# Patient Record
Sex: Male | Born: 1985 | Race: White | Hispanic: No | Marital: Single | Smoking: Current every day smoker
Health system: Southern US, Community
[De-identification: ages and names within clinical notes are randomized; demographics above are authoritative.]

## PROBLEM LIST (undated history)

## (undated) DIAGNOSIS — I10 Essential (primary) hypertension: Secondary | ICD-10-CM

## (undated) DIAGNOSIS — R011 Cardiac murmur, unspecified: Secondary | ICD-10-CM

## (undated) HISTORY — PX: FRACTURE SURGERY: SHX138

---

## 2012-10-03 HISTORY — PX: PILONIDAL CYST EXCISION: SHX744

## 2012-10-03 HISTORY — PX: TONSILLECTOMY: SUR1361

## 2013-02-09 ENCOUNTER — Other Ambulatory Visit (HOSPITAL_COMMUNITY): Payer: Self-pay | Admitting: Orthopaedic Surgery

## 2013-02-09 ENCOUNTER — Encounter (HOSPITAL_COMMUNITY)
Admission: AD | Disposition: A | Discharge status: Home / Self Care | Payer: Self-pay | Source: Ambulatory Visit | Attending: Orthopaedic Surgery

## 2013-02-09 ENCOUNTER — Encounter (HOSPITAL_COMMUNITY): Payer: Self-pay

## 2013-02-09 ENCOUNTER — Encounter (HOSPITAL_COMMUNITY): Payer: Self-pay | Admitting: Anesthesiology

## 2013-02-09 ENCOUNTER — Ambulatory Visit (HOSPITAL_COMMUNITY): Payer: PRIVATE HEALTH INSURANCE | Admitting: Anesthesiology

## 2013-02-09 ENCOUNTER — Ambulatory Visit (HOSPITAL_COMMUNITY)
Admission: AD | Admit: 2013-02-09 | Discharge: 2013-02-10 | Disposition: A | Discharge status: Home / Self Care | Payer: PRIVATE HEALTH INSURANCE | Source: Ambulatory Visit | Attending: Orthopaedic Surgery | Admitting: Orthopaedic Surgery

## 2013-02-09 DIAGNOSIS — S52372A Galeazzi's fracture of left radius, initial encounter for closed fracture: Secondary | ICD-10-CM

## 2013-02-09 DIAGNOSIS — S52309A Unspecified fracture of shaft of unspecified radius, initial encounter for closed fracture: Secondary | ICD-10-CM | POA: Insufficient documentation

## 2013-02-09 DIAGNOSIS — S63016A Dislocation of distal radioulnar joint of unspecified wrist, initial encounter: Secondary | ICD-10-CM | POA: Insufficient documentation

## 2013-02-09 DIAGNOSIS — I10 Essential (primary) hypertension: Secondary | ICD-10-CM | POA: Insufficient documentation

## 2013-02-09 HISTORY — DX: Cardiac murmur, unspecified: R01.1

## 2013-02-09 HISTORY — DX: Essential (primary) hypertension: I10

## 2013-02-09 HISTORY — PX: OPEN REDUCTION INTERNAL FIXATION (ORIF) DISTAL RADIAL FRACTURE: SHX5989

## 2013-02-09 LAB — POCT I-STAT 4, (NA,K, GLUC, HGB,HCT): Glucose, Bld: 84 mg/dL (ref 70–99)

## 2013-02-09 SURGERY — OPEN REDUCTION INTERNAL FIXATION (ORIF) DISTAL RADIUS FRACTURE
Anesthesia: General | Site: Arm Lower | Laterality: Left | Wound class: Clean

## 2013-02-09 MED ORDER — DEXTROSE 5 % IV SOLN
500.0000 mg | Freq: Four times a day (QID) | INTRAVENOUS | Status: DC | PRN
  Filled 2013-02-09: qty 5

## 2013-02-09 MED ORDER — HYDROMORPHONE HCL PF 1 MG/ML IJ SOLN: 1.0000 mg | INTRAMUSCULAR | Status: DC | PRN

## 2013-02-09 MED ORDER — SODIUM CHLORIDE 0.9 % IV SOLN
INTRAVENOUS | Status: DC
  Administered 2013-02-10: 04:00:00 via INTRAVENOUS

## 2013-02-09 MED ORDER — OXYCODONE HCL 5 MG PO TABS
5.0000 mg | ORAL_TABLET | ORAL | Status: DC | PRN
  Administered 2013-02-09: 5 mg via ORAL

## 2013-02-09 MED ORDER — ROCURONIUM BROMIDE 100 MG/10ML IV SOLN
INTRAVENOUS | Status: DC | PRN
  Administered 2013-02-09: 50 mg via INTRAVENOUS

## 2013-02-09 MED ORDER — FENTANYL CITRATE 0.05 MG/ML IJ SOLN
INTRAMUSCULAR | Status: DC | PRN
  Administered 2013-02-09 (×2): 50 ug via INTRAVENOUS
  Administered 2013-02-09: 100 ug via INTRAVENOUS
  Administered 2013-02-09: 50 ug via INTRAVENOUS

## 2013-02-09 MED ORDER — DIPHENHYDRAMINE HCL 12.5 MG/5ML PO ELIX: 12.5000 mg | ORAL_SOLUTION | ORAL | Status: DC | PRN

## 2013-02-09 MED ORDER — HYDROMORPHONE HCL PF 1 MG/ML IJ SOLN
INTRAMUSCULAR | Status: AC
  Administered 2013-02-09: 22:00:00
  Filled 2013-02-09: qty 1

## 2013-02-09 MED ORDER — ONDANSETRON HCL 4 MG/2ML IJ SOLN: 4.0000 mg | Freq: Four times a day (QID) | INTRAMUSCULAR | Status: DC | PRN

## 2013-02-09 MED ORDER — NEOSTIGMINE METHYLSULFATE 1 MG/ML IJ SOLN
INTRAMUSCULAR | Status: DC | PRN
  Administered 2013-02-09: 4 mg via INTRAVENOUS

## 2013-02-09 MED ORDER — LACTATED RINGERS IV SOLN
INTRAVENOUS | Status: DC
  Administered 2013-02-09: 18:00:00 via INTRAVENOUS

## 2013-02-09 MED ORDER — OXYCODONE HCL 5 MG PO TABS: 5.0000 mg | ORAL_TABLET | Freq: Once | ORAL | Status: DC | PRN

## 2013-02-09 MED ORDER — OXYCODONE HCL 5 MG PO TABS
ORAL_TABLET | ORAL | Status: AC
  Filled 2013-02-09: qty 1

## 2013-02-09 MED ORDER — SUCCINYLCHOLINE CHLORIDE 20 MG/ML IJ SOLN
INTRAMUSCULAR | Status: DC | PRN
  Administered 2013-02-09: 180 mg via INTRAVENOUS

## 2013-02-09 MED ORDER — ZOLPIDEM TARTRATE 5 MG PO TABS: 5.0000 mg | ORAL_TABLET | Freq: Every evening | ORAL | Status: DC | PRN

## 2013-02-09 MED ORDER — LACTATED RINGERS IV SOLN
INTRAVENOUS | Status: DC | PRN
  Administered 2013-02-09 (×2): via INTRAVENOUS

## 2013-02-09 MED ORDER — METOCLOPRAMIDE HCL 5 MG/ML IJ SOLN: 5.0000 mg | Freq: Three times a day (TID) | INTRAMUSCULAR | Status: DC | PRN

## 2013-02-09 MED ORDER — BUPIVACAINE HCL (PF) 0.25 % IJ SOLN
INTRAMUSCULAR | Status: DC | PRN
  Administered 2013-02-09: 10 mL

## 2013-02-09 MED ORDER — MORPHINE SULFATE 2 MG/ML IJ SOLN
1.0000 mg | INTRAMUSCULAR | Status: DC | PRN
  Administered 2013-02-09: 1 mg via INTRAVENOUS

## 2013-02-09 MED ORDER — FENTANYL CITRATE 0.05 MG/ML IJ SOLN
INTRAMUSCULAR | Status: AC
  Administered 2013-02-09: 100 ug via INTRAVENOUS
  Filled 2013-02-09: qty 2

## 2013-02-09 MED ORDER — METHOCARBAMOL 500 MG PO TABS
ORAL_TABLET | ORAL | Status: AC
  Filled 2013-02-09: qty 1

## 2013-02-09 MED ORDER — BUPIVACAINE HCL (PF) 0.25 % IJ SOLN
INTRAMUSCULAR | Status: AC
  Filled 2013-02-09: qty 30

## 2013-02-09 MED ORDER — HYDROMORPHONE HCL PF 1 MG/ML IJ SOLN: 0.2500 mg | INTRAMUSCULAR | Status: DC | PRN

## 2013-02-09 MED ORDER — CLINDAMYCIN PHOSPHATE 600 MG/50ML IV SOLN
600.0000 mg | Freq: Four times a day (QID) | INTRAVENOUS | Status: DC
  Administered 2013-02-10 (×2): 600 mg via INTRAVENOUS
  Filled 2013-02-09 (×5): qty 50

## 2013-02-09 MED ORDER — MORPHINE SULFATE 2 MG/ML IJ SOLN
INTRAMUSCULAR | Status: AC
  Filled 2013-02-09: qty 1

## 2013-02-09 MED ORDER — 0.9 % SODIUM CHLORIDE (POUR BTL) OPTIME
TOPICAL | Status: DC | PRN
  Administered 2013-02-09: 1000 mL

## 2013-02-09 MED ORDER — ONDANSETRON HCL 4 MG PO TABS: 4.0000 mg | ORAL_TABLET | Freq: Four times a day (QID) | ORAL | Status: DC | PRN

## 2013-02-09 MED ORDER — FENTANYL CITRATE 0.05 MG/ML IJ SOLN
INTRAMUSCULAR | Status: AC
  Administered 2013-02-09: 100 ug
  Filled 2013-02-09: qty 2

## 2013-02-09 MED ORDER — GLYCOPYRROLATE 0.2 MG/ML IJ SOLN
INTRAMUSCULAR | Status: DC | PRN
  Administered 2013-02-09: .6 mg via INTRAVENOUS

## 2013-02-09 MED ORDER — METOCLOPRAMIDE HCL 10 MG PO TABS: 5.0000 mg | ORAL_TABLET | Freq: Three times a day (TID) | ORAL | Status: DC | PRN

## 2013-02-09 MED ORDER — MIDAZOLAM HCL 2 MG/2ML IJ SOLN
INTRAMUSCULAR | Status: AC
  Administered 2013-02-09: 2 mg via INTRAVENOUS
  Filled 2013-02-09: qty 2

## 2013-02-09 MED ORDER — CLINDAMYCIN PHOSPHATE 900 MG/50ML IV SOLN
900.0000 mg | INTRAVENOUS | Status: DC
  Filled 2013-02-09: qty 50

## 2013-02-09 MED ORDER — HYDROCODONE-ACETAMINOPHEN 5-325 MG PO TABS
1.0000 | ORAL_TABLET | ORAL | Status: DC | PRN
  Administered 2013-02-10 (×3): 2 via ORAL
  Filled 2013-02-09 (×3): qty 2

## 2013-02-09 MED ORDER — MIDAZOLAM HCL 2 MG/2ML IJ SOLN
INTRAMUSCULAR | Status: AC
  Administered 2013-02-09: 2 mg
  Filled 2013-02-09: qty 2

## 2013-02-09 MED ORDER — CLINDAMYCIN PHOSPHATE 900 MG/50ML IV SOLN
900.0000 mg | INTRAVENOUS | Status: AC
  Administered 2013-02-09: 900 mg via INTRAVENOUS
  Filled 2013-02-09: qty 50

## 2013-02-09 MED ORDER — ONDANSETRON HCL 4 MG/2ML IJ SOLN
INTRAMUSCULAR | Status: DC | PRN
  Administered 2013-02-09: 4 mg via INTRAVENOUS

## 2013-02-09 MED ORDER — PROPOFOL 10 MG/ML IV BOLUS
INTRAVENOUS | Status: DC | PRN
  Administered 2013-02-09: 250 mg via INTRAVENOUS

## 2013-02-09 MED ORDER — LIDOCAINE HCL (CARDIAC) 20 MG/ML IV SOLN
INTRAVENOUS | Status: DC | PRN
  Administered 2013-02-09: 80 mg via INTRAVENOUS

## 2013-02-09 MED ORDER — METHOCARBAMOL 500 MG PO TABS
500.0000 mg | ORAL_TABLET | Freq: Four times a day (QID) | ORAL | Status: DC | PRN
  Administered 2013-02-09: 500 mg via ORAL
  Filled 2013-02-09: qty 1

## 2013-02-09 MED ORDER — OXYCODONE HCL 5 MG/5ML PO SOLN: 5.0000 mg | Freq: Once | ORAL | Status: DC | PRN

## 2013-02-09 SURGICAL SUPPLY — 64 items
BANDAGE ELASTIC 4 VELCRO ST LF (GAUZE/BANDAGES/DRESSINGS) ×2 IMPLANT
BANDAGE ELASTIC 6 VELCRO ST LF (GAUZE/BANDAGES/DRESSINGS) ×1 IMPLANT
BANDAGE GAUZE ELAST BULKY 4 IN (GAUZE/BANDAGES/DRESSINGS) ×2 IMPLANT
BIT DRILL 2.0 QC 6.3IN (BIT) ×1 IMPLANT
BIT DRILL QC 2.7 6.3IN  SHORT (BIT) ×1
BIT DRILL QC 2.7 6.3IN SHORT (BIT) IMPLANT
BNDG CMPR 9X4 STRL LF SNTH (GAUZE/BANDAGES/DRESSINGS) ×1
BNDG ESMARK 4X9 LF (GAUZE/BANDAGES/DRESSINGS) ×2 IMPLANT
CLOTH BEACON ORANGE TIMEOUT ST (SAFETY) ×2 IMPLANT
CORDS BIPOLAR (ELECTRODE) ×2 IMPLANT
COVER SURGICAL LIGHT HANDLE (MISCELLANEOUS) ×2 IMPLANT
CUFF TOURNIQUET SINGLE 24IN (TOURNIQUET CUFF) ×1 IMPLANT
DRAPE OEC MINIVIEW 54X84 (DRAPES) ×2 IMPLANT
DURAPREP 26ML APPLICATOR (WOUND CARE) ×2 IMPLANT
ELECT REM PT RETURN 9FT ADLT (ELECTROSURGICAL) ×2
ELECTRODE REM PT RTRN 9FT ADLT (ELECTROSURGICAL) ×1 IMPLANT
GAUZE XEROFORM 1X8 LF (GAUZE/BANDAGES/DRESSINGS) ×2 IMPLANT
GLOVE BIOGEL PI IND STRL 6.5 (GLOVE) IMPLANT
GLOVE BIOGEL PI IND STRL 8 (GLOVE) ×1 IMPLANT
GLOVE BIOGEL PI INDICATOR 6.5 (GLOVE) ×1
GLOVE BIOGEL PI INDICATOR 8 (GLOVE) ×2
GLOVE ORTHO TXT STRL SZ7.5 (GLOVE) ×1 IMPLANT
GLOVE SURG SS PI 7.5 STRL IVOR (GLOVE) ×1 IMPLANT
GOWN PREVENTION PLUS XLARGE (GOWN DISPOSABLE) ×4 IMPLANT
GOWN STRL NON-REIN LRG LVL3 (GOWN DISPOSABLE) ×2 IMPLANT
HOVERMATT SINGLE USE (MISCELLANEOUS) ×1 IMPLANT
K-WIRE 1.6 (WIRE) ×2
K-WIRE FX150X1.6XTROC PNT (WIRE) ×1
KIT BASIN OR (CUSTOM PROCEDURE TRAY) ×2 IMPLANT
KIT ROOM TURNOVER OR (KITS) ×2 IMPLANT
KWIRE FX150X1.6XTROC PNT (WIRE) IMPLANT
MANIFOLD NEPTUNE II (INSTRUMENTS) ×2 IMPLANT
NEEDLE 22X1 1/2 (OR ONLY) (NEEDLE) ×1 IMPLANT
NS IRRIG 1000ML POUR BTL (IV SOLUTION) ×2 IMPLANT
PACK ORTHO EXTREMITY (CUSTOM PROCEDURE TRAY) ×2 IMPLANT
PAD ARMBOARD 7.5X6 YLW CONV (MISCELLANEOUS) ×4 IMPLANT
PAD CAST 4YDX4 CTTN HI CHSV (CAST SUPPLIES) ×1 IMPLANT
PADDING CAST COTTON 4X4 STRL (CAST SUPPLIES) ×2
PADDING CAST COTTON 6X4 STRL (CAST SUPPLIES) ×1 IMPLANT
PLATE COMPRESSION (Plate) ×1 IMPLANT
SCREW 3.5X14MM (Screw) ×2 IMPLANT
SCREW CORTEX 2.7X20 (Screw) ×1 IMPLANT
SCREW NL 3.5X18 (Screw) ×2 IMPLANT
SCREW NON LOCK 3.5X16MM (Screw) ×3 IMPLANT
SPLINT PLASTER CAST XFAST 5X30 (CAST SUPPLIES) IMPLANT
SPLINT PLASTER XFAST SET 5X30 (CAST SUPPLIES) ×1
SPONGE GAUZE 4X4 12PLY (GAUZE/BANDAGES/DRESSINGS) ×2 IMPLANT
SPONGE LAP 18X18 X RAY DECT (DISPOSABLE) ×1 IMPLANT
SPONGE LAP 4X18 X RAY DECT (DISPOSABLE) ×1 IMPLANT
STRIP CLOSURE SKIN 1/2X4 (GAUZE/BANDAGES/DRESSINGS) ×2 IMPLANT
SUCTION FRAZIER TIP 10 FR DISP (SUCTIONS) ×2 IMPLANT
SUT ETHILON 3 0 PS 1 (SUTURE) ×3 IMPLANT
SUT PROLENE 3 0 PS 1 (SUTURE) ×2 IMPLANT
SUT VIC AB 0 CT1 27 (SUTURE) ×2
SUT VIC AB 0 CT1 27XBRD ANBCTR (SUTURE) IMPLANT
SUT VIC AB 2-0 CT1 27 (SUTURE) ×4
SUT VIC AB 2-0 CT1 TAPERPNT 27 (SUTURE) IMPLANT
SUT VIC AB 3-0 X1 27 (SUTURE) ×2 IMPLANT
SYR CONTROL 10ML LL (SYRINGE) ×1 IMPLANT
TOWEL OR 17X24 6PK STRL BLUE (TOWEL DISPOSABLE) ×2 IMPLANT
TOWEL OR 17X26 10 PK STRL BLUE (TOWEL DISPOSABLE) ×2 IMPLANT
TUBE CONNECTING 12X1/4 (SUCTIONS) ×2 IMPLANT
UNDERPAD 30X30 INCONTINENT (UNDERPADS AND DIAPERS) ×2 IMPLANT
WATER STERILE IRR 1000ML POUR (IV SOLUTION) ×2 IMPLANT

## 2013-02-09 NOTE — OR Nursing (Signed)
Reviewed with recv'ing nurse/genevieve rn, of delayed cap refil l thumb/index finger 3-4secs. of
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
## 2013-02-09 NOTE — Brief Op Note (Signed)
02/09/2013  8:33 PM  PATIENT:  Shane Sullivan  27 y.o. male  PRE-OPERATIVE DIAGNOSIS:  Left radial shaft fracture with wrist dislocation  POST-OPERATIVE DIAGNOSIS:  Left radial shaft fracture with wrist dislocation  PROCEDURE:  Procedure(s): OPEN REDUCTION INTERNAL FIXATION (ORIF) LEFT RADIAL SHAFT FRACTURE (Left)  SURGEON:  Surgeon(s) and Role:    * Kathryne Hitch, MD - Primary  PHYSICIAN ASSISTANT: Rexene Edison, PA-C   ANESTHESIA:   local and general  EBL:   min  BLOOD ADMINISTERED:none  DRAINS: none   LOCAL MEDICATIONS USED:  MARCAINE     SPECIMEN:  No Specimen  DISPOSITION OF SPECIMEN:  N/A  COUNTS:  YES  TOURNIQUET:   Total Tourniquet Time Documented: Upper Arm (Left) - 75 minutes Total: Upper Arm (Left) - 75 minutes   DICTATION: .Other Dictation: Dictation Number [id number]  PLAN OF CARE: Admit for overnight observation  PATIENT DISPOSITION:  PACU - hemodynamically stable.   Delay start of Pharmacological VTE agent (>24hrs) due to surgical blood loss or risk of bleeding: not applicable

## 2013-02-09 NOTE — OR Nursing (Signed)
Dr Magnus Ivan notified of diminished cap refill  Of l thumb and index finger/ hand/arm elevated on bedside table/ pt encouraged to exercise fingers.thumbs / MD does not want to loosen or adjust splint---> will place in elevator sling

## 2013-02-09 NOTE — Anesthesia Preprocedure Evaluation (Signed)
Anesthesia Evaluation  Patient identified by MRN, date of birth, ID band Patient awake    Reviewed: Allergy & Precautions, H&P , NPO status , Patient's Chart, lab work & pertinent test results  Airway Mallampati: II TM Distance: >3 FB Neck ROM: Full    Dental  (+) Dental Advisory Given   Pulmonary Current Smoker,          Cardiovascular     Neuro/Psych    GI/Hepatic   Endo/Other  Morbid obesity  Renal/GU      Musculoskeletal   Abdominal   Peds  Hematology   Anesthesia Other Findings Pt denies HTN, scheduled for sleep study to rule out sleep apnea  Reproductive/Obstetrics                           Anesthesia Physical Anesthesia Plan  ASA: II  Anesthesia Plan: General   Post-op Pain Management:    Induction: Intravenous  Airway Management Planned: Oral ETT  Additional Equipment:   Intra-op Plan:   Post-operative Plan: Extubation in OR  Informed Consent: I have reviewed the patients History and Physical, chart, labs and discussed the procedure including the risks, benefits and alternatives for the proposed anesthesia with the patient or authorized representative who has indicated his/her understanding and acceptance.   Dental advisory given  Plan Discussed with: CRNA, Anesthesiologist and Surgeon  Anesthesia Plan Comments:         Anesthesia Quick Evaluation

## 2013-02-09 NOTE — Progress Notes (Signed)
Orthopedic Tech Progress Note Patient Details:  Shane Sullivan Dec 11, 1985 [id number]  Ortho Devices Ortho Device/Splint Location: RUE kuzman sling Ortho Device/Splint Interventions: Ordered;Application   Jennye Moccasin 02/09/2013, 9:15 PM

## 2013-02-09 NOTE — Anesthesia Postprocedure Evaluation (Signed)
Anesthesia Post Note  Patient: Shane Sullivan  Procedure(s) Performed: Procedure(s) (LRB): OPEN REDUCTION INTERNAL FIXATION (ORIF) LEFT RADIAL SHAFT FRACTURE (Left)  Anesthesia type: General  Patient location: PACU  Post pain: Pain level controlled and Adequate analgesia  Post assessment: Post-op Vital signs reviewed, Patient's Cardiovascular Status Stable, Respiratory Function Stable, Patent Airway and Pain level controlled  Last Vitals:  Filed Vitals:   02/09/13 2054  BP: 144/92  Pulse: 81  Temp:   Resp: 24    Post vital signs: Reviewed and stable  Level of consciousness: awake, alert  and oriented  Complications: No apparent anesthesia complications

## 2013-02-09 NOTE — Anesthesia Procedure Notes (Signed)
Procedure Name: Intubation Date/Time: 02/09/2013 6:38 PM Performed by: Rogelia Boga Pre-anesthesia Checklist: Patient identified, Emergency Drugs available, Suction available, Patient being monitored and Timeout performed Patient Re-evaluated:Patient Re-evaluated prior to inductionOxygen Delivery Method: Circle system utilized Preoxygenation: Pre-oxygenation with 100% oxygen Intubation Type: IV induction and Rapid sequence Laryngoscope Size: Mac and 4 Grade View: Grade I Tube type: Oral Number of attempts: 1 Airway Equipment and Method: Stylet Placement Confirmation: ETT inserted through vocal cords under direct vision,  positive ETCO2 and breath sounds checked- equal and bilateral Secured at: 23 cm Tube secured with: Tape Dental Injury: Teeth and Oropharynx as per pre-operative assessment

## 2013-02-09 NOTE — Preoperative (Signed)
Beta Blockers   Reason not to administer Beta Blockers:Not Applicable 

## 2013-02-09 NOTE — Progress Notes (Signed)
OR, Velna Hatchet asked to make sure hover mat is on stretcher when they come to pick up patient.

## 2013-02-09 NOTE — Transfer of Care (Signed)
Immediate Anesthesia Transfer of Care Note  Patient: Shane Sullivan  Procedure(s) Performed: Procedure(s): OPEN REDUCTION INTERNAL FIXATION (ORIF) LEFT RADIAL SHAFT FRACTURE (Left)  Patient Location: PACU  Anesthesia Type:General  Level of Consciousness: awake, alert  and patient cooperative  Airway & Oxygen Therapy: Patient Spontanous Breathing and Patient connected to nasal cannula oxygen  Post-op Assessment: Report given to PACU RN, Post -op Vital signs reviewed and stable and Patient moving all extremities X 4  Post vital signs: Reviewed and stable  Complications: No apparent anesthesia complications

## 2013-02-10 MED ORDER — METHOCARBAMOL 500 MG PO TABS: 500.0000 mg | ORAL_TABLET | Freq: Four times a day (QID) | ORAL | Status: AC | PRN

## 2013-02-10 MED ORDER — OXYCODONE-ACETAMINOPHEN 5-325 MG PO TABS: 1.0000 | ORAL_TABLET | ORAL | Status: AC | PRN

## 2013-02-10 MED ORDER — VITAMIN C 500 MG PO TABS: 500.0000 mg | ORAL_TABLET | Freq: Every day | ORAL | Status: AC

## 2013-02-10 NOTE — Discharge Summary (Signed)
Patient ID: Shane Sullivan MRN: [id number] DOB/AGE: 1985/12/04 27 y.o.  Admit date: 02/09/2013 Discharge date: 02/10/2013  Admission Diagnoses:  Principal Problem:   Galeazzi's fracture of left radius   Discharge Diagnoses:  Same  Past Medical History  Diagnosis Date  . Heart murmur     as child  . Hypertension     took med for 1 month    Surgeries: Procedure(s): OPEN REDUCTION INTERNAL FIXATION (ORIF) LEFT RADIAL SHAFT FRACTURE on 02/09/2013   Consultants:  None  Discharged Condition: Improved  Hospital Course: Shane Sullivan is an 27 y.o. male who was admitted 02/09/2013 for operative treatment ofGaleazzi's fracture of left radius. Patient has severe unremitting pain that affects sleep, daily activities, and work/hobbies. After pre-op clearance the patient was taken to the operating room on 02/09/2013 and underwent  Procedure(s): OPEN REDUCTION INTERNAL FIXATION (ORIF) LEFT RADIAL SHAFT FRACTURE.    Patient was given perioperative antibiotics: Anti-infectives   Start     Dose/Rate Route Frequency Ordered Stop   02/10/13 0600  clindamycin (CLEOCIN) IVPB 900 mg     900 mg 100 mL/hr over 30 Minutes Intravenous On call to O.R. 02/09/13 1445 02/09/13 1848   02/10/13 0300  clindamycin (CLEOCIN) IVPB 600 mg     600 mg 100 mL/hr over 30 Minutes Intravenous Every 6 hours 02/09/13 2206 02/10/13 2059   02/09/13 1830  clindamycin (CLEOCIN) IVPB 900 mg  Status:  Discontinued     900 mg 100 mL/hr over 30 Minutes Intravenous To Surgery 02/09/13 1827 02/09/13 2126       Patient was given sequential compression devices, early ambulation, and chemoprophylaxis to prevent DVT.  Patient benefited maximally from hospital stay and there were no complications.    Recent vital signs: Patient Vitals for the past 24 hrs:  BP Temp Temp src Pulse Resp SpO2 Height Weight  02/10/13 0551 156/52 mmHg 98.2 F (36.8 C) - 112 20 93 % - -  02/09/13 2135 130/63 mmHg 98 F (36.7 C) - 68 20 95 % - -   02/09/13 2115 - 98.1 F (36.7 C) - - - - - -  02/09/13 2054 144/92 mmHg - - 81 24 96 % - -  02/09/13 2034 137/59 mmHg 98.8 F (37.1 C) - 84 27 96 % - -  02/09/13 1822 135/74 mmHg - - 84 23 100 % - -  02/09/13 1812 - - - - 24 100 % - -  02/09/13 1808 - - - - 22 100 % - -  02/09/13 1750 - - - 84 23 100 % - -  02/09/13 1531 128/61 mmHg 97.8 F (36.6 C) Oral 62 16 99 % - -  02/09/13 1528 - - - - - - 6' 6.5" (1.994 m) 166 kg (365 lb 15.4 oz)     Recent laboratory studies:  Recent Labs  02/09/13 1740  HGB 16.0  HCT 47.0  NA 142  K 4.0  GLUCOSE 84     Discharge Medications:     Medication List         methocarbamol 500 MG tablet  Commonly known as:  ROBAXIN  Take 1 tablet (500 mg total) by mouth every 6 (six) hours as needed.     oxyCODONE-acetaminophen 5-325 MG per tablet  Commonly known as:  ROXICET  Take 1-2 tablets by mouth every 4 (four) hours as needed for pain.     vitamin C 500 MG tablet  Commonly known as:  ASCORBIC ACID  Take 1 tablet (  500 mg total) by mouth daily.        Diagnostic Studies: No results found.  Disposition:Home      Discharge Orders   Future Orders Complete By Expires     Discharge wound care:  As directed     Comments:      Keep splint clean dry and intact    Elevate operative extremity  As directed     Comments:      Encourage patient to wiggle fingers often    Non weight bearing  As directed     Comments:      Non weight bearing left arm          Signed: Richardean Canal 02/10/2013, 7:58 AM

## 2013-02-10 NOTE — Progress Notes (Signed)
Subjective: 1 Day Post-Op Procedure(s) (LRB): OPEN REDUCTION INTERNAL FIXATION (ORIF) LEFT RADIAL SHAFT FRACTURE (Left) Patient reports pain as moderate.    Objective: Vital signs in last 24 hours: Temp:  [97.8 F (36.6 C)-98.8 F (37.1 C)] 98.2 F (36.8 C) (07/09 0551) Pulse Rate:  [62-112] 112 (07/09 0551) Resp:  [16-27] 20 (07/09 0551) BP: (128-156)/(52-92) 156/52 mmHg (07/09 0551) SpO2:  [93 %-100 %] 93 % (07/09 0551) Weight:  [166 kg (365 lb 15.4 oz)] 166 kg (365 lb 15.4 oz) (07/08 1528)  Intake/Output from previous day: 07/08 0701 - 07/09 0700 In: 1500 [P.O.:200; I.V.:1300] Out: -  Intake/Output this shift:     Recent Labs  02/09/13 1740  HGB 16.0    Recent Labs  02/09/13 1740  HCT 47.0    Recent Labs  02/09/13 1740  NA 142  K 4.0  GLUCOSE 84   No results found for this basename: LABPT, INR,  in the last 72 hours  Left upper extremity: Sensation intact left fingertips. Good capillary refill to fingers. Splint clean dry and intact.  Assessment/Plan: 1 Day Post-Op Procedure(s) (LRB): OPEN REDUCTION INTERNAL FIXATION (ORIF) LEFT RADIAL SHAFT FRACTURE (Left) Discharge to home Non weight bearing left upper extremity Keep splint clean dry and intact Follow up with Dr. Magnus Ivan in 2 weeks  [place], Sullivan Lone 02/10/2013, 7:50 AM
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
## 2013-02-12 ENCOUNTER — Encounter (HOSPITAL_COMMUNITY): Payer: Self-pay | Admitting: Orthopaedic Surgery

## 2013-03-17 ENCOUNTER — Encounter (HOSPITAL_COMMUNITY): Payer: Self-pay | Admitting: Orthopaedic Surgery

## 2013-03-19 ENCOUNTER — Encounter (HOSPITAL_COMMUNITY): Payer: Self-pay | Admitting: Orthopaedic Surgery

## 2020-03-20 ENCOUNTER — Emergency Department (HOSPITAL_COMMUNITY): Payer: PRIVATE HEALTH INSURANCE

## 2020-03-20 ENCOUNTER — Emergency Department (HOSPITAL_COMMUNITY)
Admission: EM | Admit: 2020-03-20 | Discharge: 2020-03-21 | Disposition: A | Discharge status: Home / Self Care | Payer: PRIVATE HEALTH INSURANCE | Attending: Emergency Medicine | Admitting: Emergency Medicine

## 2020-03-20 ENCOUNTER — Other Ambulatory Visit: Payer: Self-pay

## 2020-03-20 ENCOUNTER — Encounter (HOSPITAL_COMMUNITY): Payer: Self-pay | Admitting: Emergency Medicine

## 2020-03-20 DIAGNOSIS — I861 Scrotal varices: Secondary | ICD-10-CM | POA: Diagnosis not present

## 2020-03-20 DIAGNOSIS — Z79899 Other long term (current) drug therapy: Secondary | ICD-10-CM | POA: Insufficient documentation

## 2020-03-20 DIAGNOSIS — F17210 Nicotine dependence, cigarettes, uncomplicated: Secondary | ICD-10-CM | POA: Insufficient documentation

## 2020-03-20 DIAGNOSIS — N50812 Left testicular pain: Secondary | ICD-10-CM

## 2020-03-20 DIAGNOSIS — R1032 Left lower quadrant pain: Secondary | ICD-10-CM

## 2020-03-20 LAB — COMPREHENSIVE METABOLIC PANEL
ALT: 19 U/L (ref 0–44)
AST: 16 U/L (ref 15–41)
Albumin: 3.5 g/dL (ref 3.5–5.0)
Alkaline Phosphatase: 88 U/L (ref 38–126)
Anion gap: 9 (ref 5–15)
BUN: <5 mg/dL — ABNORMAL LOW (ref 6–20)
CO2: 24 mmol/L (ref 22–32)
Calcium: 9 mg/dL (ref 8.9–10.3)
Chloride: 102 mmol/L (ref 98–111)
Creatinine, Ser: 0.87 mg/dL (ref 0.61–1.24)
GFR calc Af Amer: >60 mL/min (ref >60)
GFR calc non Af Amer: >60 mL/min (ref >60)
Glucose, Bld: 107 mg/dL — ABNORMAL HIGH (ref 70–99)
Potassium: 3.6 mmol/L (ref 3.5–5.1)
Sodium: 135 mmol/L (ref 135–145)
Total Bilirubin: 0.6 mg/dL (ref 0.3–1.2)
Total Protein: 7.3 g/dL (ref 6.5–8.1)

## 2020-03-20 LAB — URINALYSIS, ROUTINE W REFLEX MICROSCOPIC
Bacteria, UA: NONE SEEN
Bilirubin Urine: NEGATIVE
Glucose, UA: NEGATIVE mg/dL
Ketones, ur: NEGATIVE mg/dL
Leukocytes,Ua: NEGATIVE
Nitrite: NEGATIVE
Protein, ur: NEGATIVE mg/dL
Specific Gravity, Urine: 1.024 (ref 1.005–1.030)
pH: 6 (ref 5.0–8.0)

## 2020-03-20 LAB — CBC WITH DIFFERENTIAL/PLATELET
Abs Immature Granulocytes: 0.04 10*3/uL (ref 0.00–0.07)
Basophils Absolute: 0 10*3/uL (ref 0.0–0.1)
Basophils Relative: 0 %
Eosinophils Absolute: 0.3 10*3/uL (ref 0.0–0.5)
Eosinophils Relative: 3 %
HCT: 44.5 % (ref 39.0–52.0)
Hemoglobin: 14 g/dL (ref 13.0–17.0)
Immature Granulocytes: 0 %
Lymphocytes Relative: 14 %
Lymphs Abs: 1.5 10*3/uL (ref 0.7–4.0)
MCH: 29.3 pg (ref 26.0–34.0)
MCHC: 31.5 g/dL (ref 30.0–36.0)
MCV: 93.1 fL (ref 80.0–100.0)
Monocytes Absolute: 1.4 10*3/uL — ABNORMAL HIGH (ref 0.1–1.0)
Monocytes Relative: 13 %
Neutro Abs: 7.9 10*3/uL — ABNORMAL HIGH (ref 1.7–7.7)
Neutrophils Relative %: 70 %
Platelets: 141 10*3/uL — ABNORMAL LOW (ref 150–400)
RBC: 4.78 MIL/uL (ref 4.22–5.81)
RDW: 13.2 % (ref 11.5–15.5)
WBC: 11.2 10*3/uL — ABNORMAL HIGH (ref 4.0–10.5)
nRBC: 0 % (ref 0.0–0.2)

## 2020-03-20 NOTE — ED Triage Notes (Signed)
Pt to ED with c/o pain in left groin radiating into left testicle x's 2 days.  No known injury

## 2020-03-21 NOTE — ED Provider Notes (Signed)
Shane Sullivan [facility] EMERGENCY DEPARTMENT Provider Note   CSN: [id number] Arrival date & time: 03/20/20  1955     History Chief Complaint  Patient presents with  . Testicle Pain    Shane Sullivan is a 34 y.o. male.  Patient presents with left groin pain for 2 days.  Intermittent.  Worse with movement and position.  No recent hernia history or injuries.  No fevers or chills.  No urinary symptoms or concern for STDs, no new sexual partners.        Past Medical History:  Diagnosis Date  . Heart murmur    as child  . Hypertension    took med for 1 month    Patient Active Problem List   Diagnosis Date Noted  . Galeazzi's fracture of left radius 02/09/2013    Past Surgical History:  Procedure Laterality Date  . FRACTURE SURGERY    . OPEN REDUCTION INTERNAL FIXATION (ORIF) DISTAL RADIAL FRACTURE Left 02/09/2013   Procedure: OPEN REDUCTION INTERNAL FIXATION (ORIF) LEFT RADIAL SHAFT FRACTURE;  Surgeon: Kathryne Hitch, MD;  Location: [facility] OR;  Service: Orthopedics;  Laterality: Left;  . PILONIDAL CYST EXCISION  10-2012  . TONSILLECTOMY  10-2012       Family History  Problem Relation Age of Onset  . Other Mother   . Hypertension Mother   . Diabetes Mother   . Hypothyroidism Mother   . Hypertension Father     Social History   Tobacco Use  . Smoking status: Current Every Day Smoker    Packs/day: 1.00    Years: 10.00    Pack years: 10.00  . Smokeless tobacco: Never Used  Substance Use Topics  . Alcohol use: Yes    Comment: rarely  . Drug use: No    Home Medications Prior to Admission medications   Medication Sig Start Date End Date Taking? Authorizing Provider  methocarbamol (ROBAXIN) 500 MG tablet Take 1 tablet (500 mg total) by mouth every 6 (six) hours as needed. 02/10/13   Kirtland Bouchard, PA-C  oxyCODONE-acetaminophen (ROXICET) 5-325 MG per tablet Take 1-2 tablets by mouth every 4 (four) hours as needed for pain. 02/10/13   Kirtland Bouchard,  PA-C  vitamin C (ASCORBIC ACID) 500 MG tablet Take 1 tablet (500 mg total) by mouth daily. 02/10/13   Kirtland Bouchard, PA-C    Allergies    Amoxicillin and Latex  Review of Systems   Review of Systems  Constitutional: Negative for chills and fever.  HENT: Negative for congestion.   Respiratory: Negative for shortness of breath.   Cardiovascular: Negative for chest pain.  Gastrointestinal: Negative for abdominal pain and vomiting.  Genitourinary: Positive for testicular pain. Negative for discharge, dysuria, flank pain, penile swelling and scrotal swelling.  Musculoskeletal: Negative for back pain, neck pain and neck stiffness.  Skin: Negative for rash.  Neurological: Negative for light-headedness and headaches.    Physical Exam Updated Vital Signs BP 140/90 (BP Location: Left Arm)   Pulse 94   Temp 98.4 F (36.9 C) (Oral)   Resp 18   Ht 6\' 4"  (1.93 m)   Wt 120.2 kg   SpO2 100%   BMI 32.26 kg/m   Physical Exam Vitals and nursing note reviewed.  Constitutional:      Appearance: He is well-developed.  HENT:     Head: Normocephalic and atraumatic.  Eyes:     General:        Right eye: No discharge.  Left eye: No discharge.     Conjunctiva/sclera: Conjunctivae normal.  Neck:     Trachea: No tracheal deviation.  Cardiovascular:     Rate and Rhythm: Normal rate and regular rhythm.  Pulmonary:     Effort: Pulmonary effort is normal.     Breath sounds: Normal breath sounds.  Abdominal:     General: There is no distension.     Palpations: Abdomen is soft.     Tenderness: There is no abdominal tenderness. There is no guarding.  Genitourinary:    Comments: Patient has mild tenderness to palpation left inguinal region with no obvious hernia palpated, no external signs of infection.  Normal position of testicles bilateral, no swelling of testicles or focal tenderness.  No rashes. Musculoskeletal:     Cervical back: Normal range of motion and neck supple.  Skin:     General: Skin is warm.     Findings: No rash.  Neurological:     Mental Status: He is alert and oriented to person, place, and time.     ED Results / Procedures / Treatments   Labs (all labs ordered are listed, but only abnormal results are displayed) Labs Reviewed  CBC WITH DIFFERENTIAL/PLATELET - Abnormal; Notable for the following components:      Result Value   WBC 11.2 (*)    Platelets 141 (*)    Neutro Abs 7.9 (*)    Monocytes Absolute 1.4 (*)    All other components within normal limits  COMPREHENSIVE METABOLIC PANEL - Abnormal; Notable for the following components:   Glucose, Bld 107 (*)    BUN <5 (*)    All other components within normal limits  URINALYSIS, ROUTINE W REFLEX MICROSCOPIC - Abnormal; Notable for the following components:   APPearance HAZY (*)    Hgb urine dipstick SMALL (*)    All other components within normal limits    EKG None  Radiology US SCROTUM W/DOPPLER  Result Date: 03/20/2020 CLINICAL DATA:  Left testicular pain for 2 days EXAM: SCROTAL ULTRASOUND DOPPLER ULTRASOUND OF THE TESTICLES TECHNIQUE: Complete ultrasound examination of the testicles, epididymis, and other scrotal structures was performed. Color and spectral Doppler ultrasound were also utilized to evaluate blood flow to the testicles. COMPARISON:  CT pelvis 07/27/2012 FINDINGS: Right testicle Measurements: 5.2 x 3.2 x 4.1 cm. Uniform parenchymal echogenicity. No concerning testicular mass or microlithiasis. Left testicle Measurements: 5.0 x 3.2 x 3.5 cm. Uniform parenchymal echogenicity. No concerning testicular mass or microlithiasis. Right epididymis:  Normal in size and appearance. Left epididymis:  Normal in size and appearance. Hydrocele:  Bilateral hydroceles, left slightly greater than right. Varicocele:  Small left varicocele is noted. Color Doppler evaluation demonstrates normal, symmetric color flow in both testes. Pulsed Doppler interrogation of both testes demonstrates normal low  resistance arterial and venous waveforms bilaterally. IMPRESSION: 1. Small left varicocele, a nonspecific often incidental finding but can present as a pain generator. 2. No convincing sonographic evidence of testicular mass or torsion. 3. Trace bilateral hydroceles. Electronically Signed   By: Kreg Shropshire M.D.   On: 03/20/2020 22:32    Procedures Procedures (including critical care time)  Medications Ordered in ED Medications - No data to display  ED Course  I have reviewed the triage vital signs and the nursing notes.  Pertinent labs & imaging results that were available during my care of the patient were reviewed by me and considered in my medical decision making (see chart for details).    MDM Rules/Calculators/A&P  Patient presents with intermittent left groin and testicular pain with differential including epididymitis, torsion, hernia, muscular strain, kidney stone, other. Blood work ordered and reviewed reassuring with white blood cell count mild elevated 11.2, normal hemoglobin, urinalysis small hemoglobin no signs of infection, normal kidney function. Ultrasound showed small varicocele, normal blood flow, no obvious sign of infection. Patient on assessment well-appearing, with no position changes he has no pain at all.  We discussed differential diagnosis and whether or not to do a CAT scan at this time.  Patient comfortable holding on CAT scan since he has no pain currently and discussed reasons to return and follow-up with specialist.   Final Clinical Impression(s) / ED Diagnoses Final diagnoses:  Left varicocele  Left groin pain    Rx / DC Orders ED Discharge Orders    None       Blane Ohara, MD 03/21/20 [phone]

## 2020-03-21 NOTE — Discharge Instructions (Signed)
Wear compressive shorts to help in case this is a small hernia.  Avoid lifting anything over 10 pounds and use proper lifting technique using your legs and not your back.  If you develop fevers, urinary symptoms, uncontrolled pain or new concerns return to the emergency room. Follow-up with specialist as discussed. Use Tylenol and ibuprofen every 6 hours for pain.

## 2020-03-22 LAB — URINE CULTURE: Culture: NO GROWTH

## 2022-04-04 IMAGING — US US SCROTUM W/ DOPPLER COMPLETE
1 series · 13 of 25 positions shown · non-contrast
Comparison: CT pelvis 07/27/2012

CLINICAL DATA: Left testicular pain for 2 days

EXAM:
SCROTAL ULTRASOUND
DOPPLER ULTRASOUND OF THE TESTICLES
TECHNIQUE: Complete ultrasound examination of the testicles, epididymis, and
other scrotal structures was performed. Color and spectral Doppler
ultrasound were also utilized to evaluate blood flow to the
testicles.

[Series 1: us scrotum w/doppler · 13 of 64 slices shown]
[im 1/64]
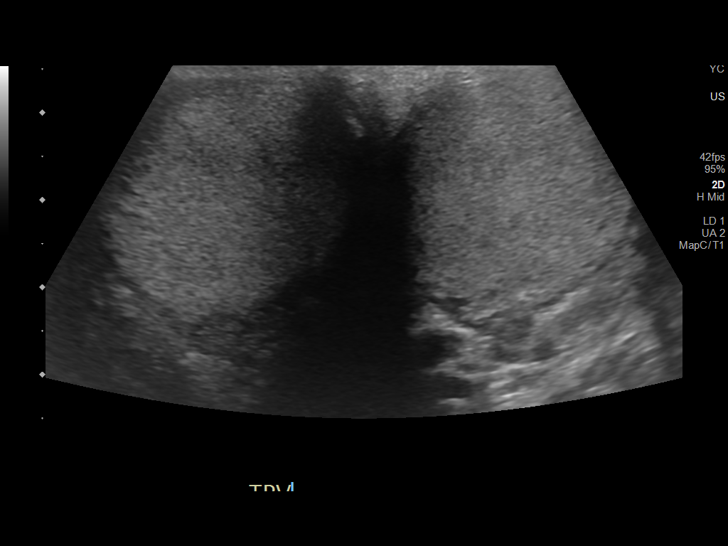
[im 6/64]
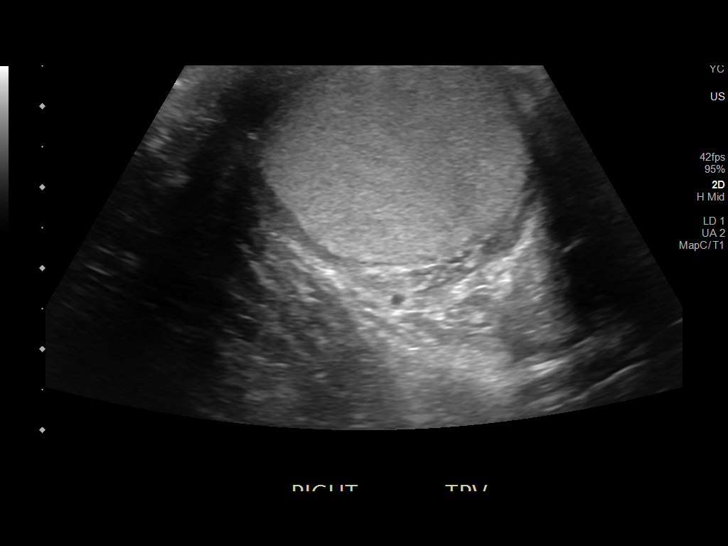
[im 11/64]
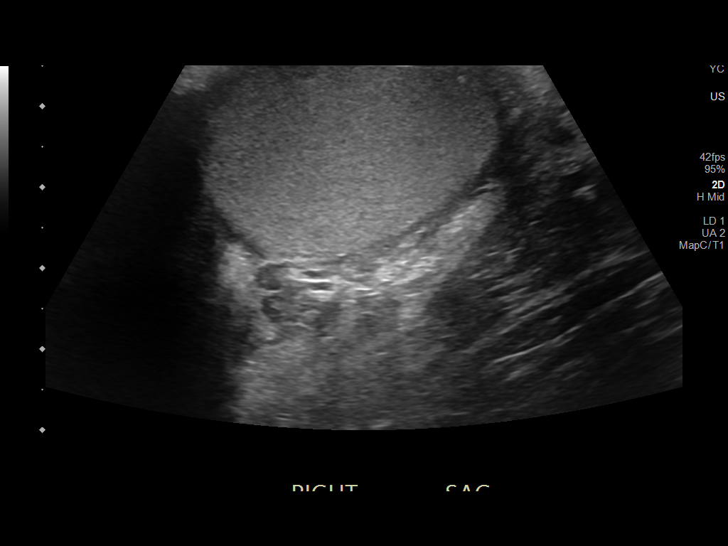
[im 16/64]
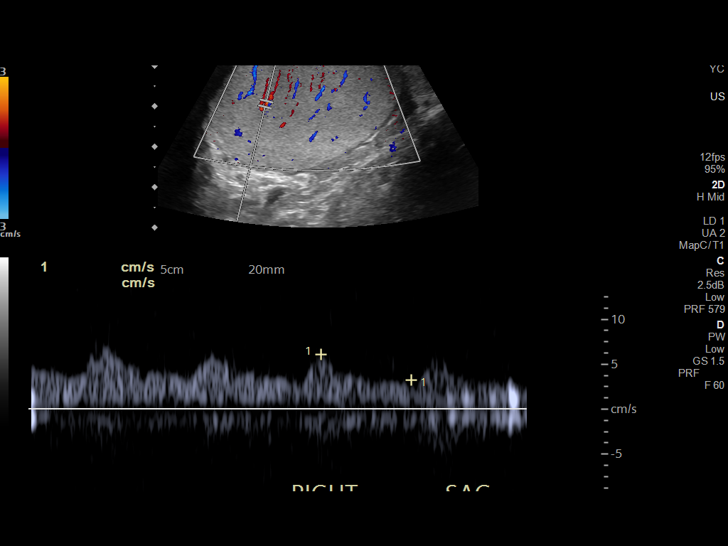
[im 22/64]
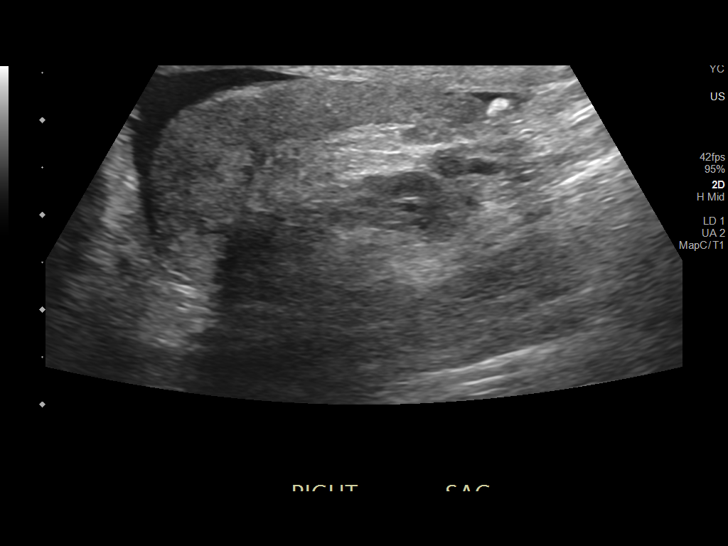
[im 27/64]
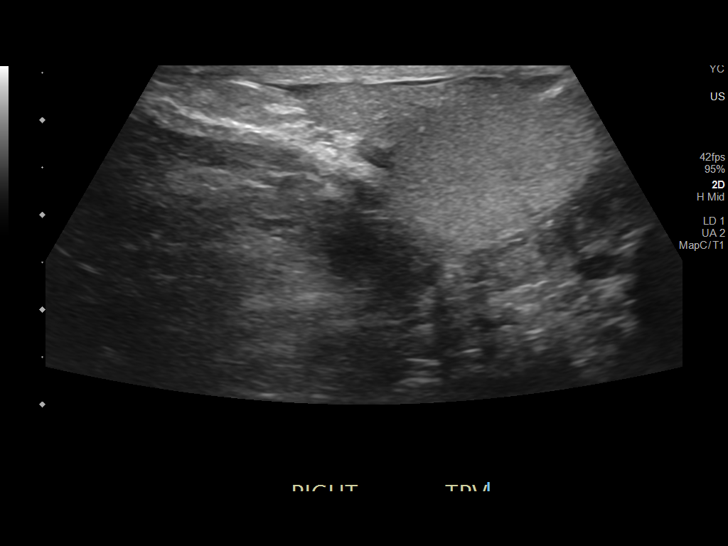
[im 32/64]
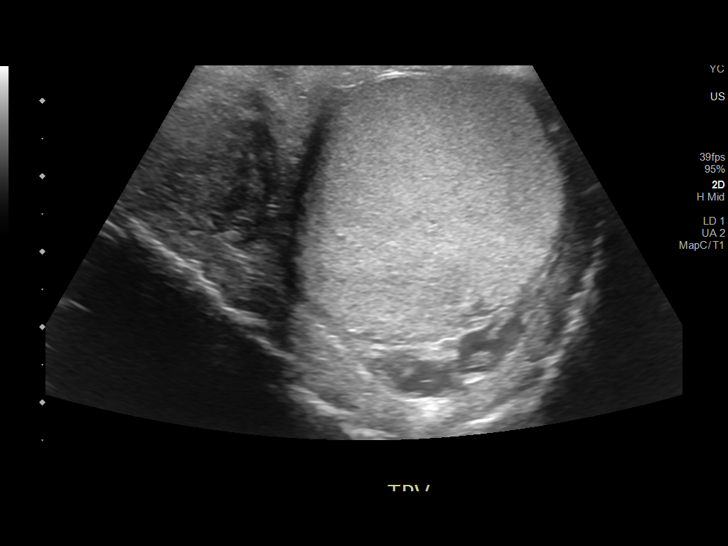
[im 37/64]
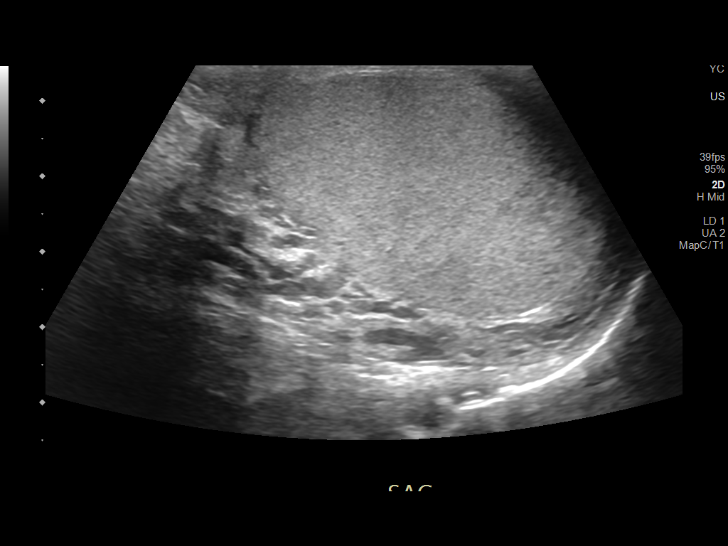
[im 43/64]
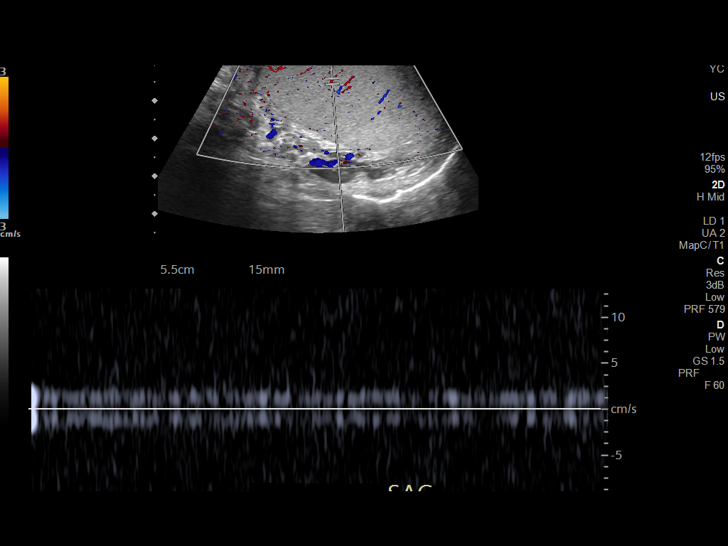
[im 48/64]
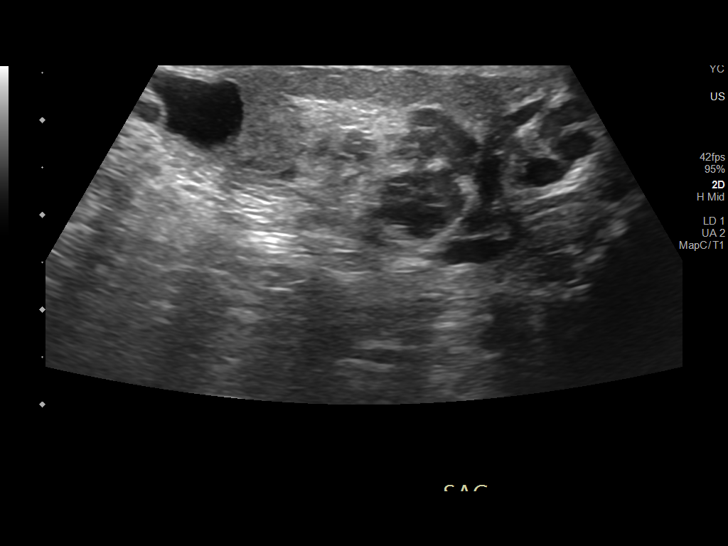
[im 53/64]
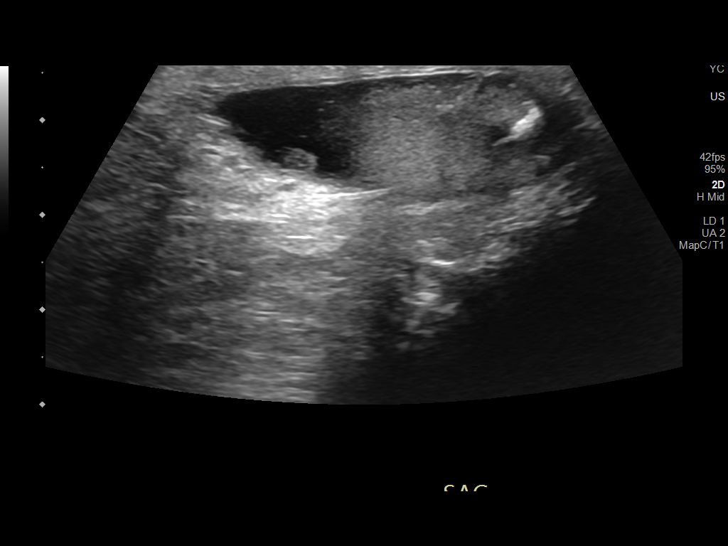
[im 58/64]
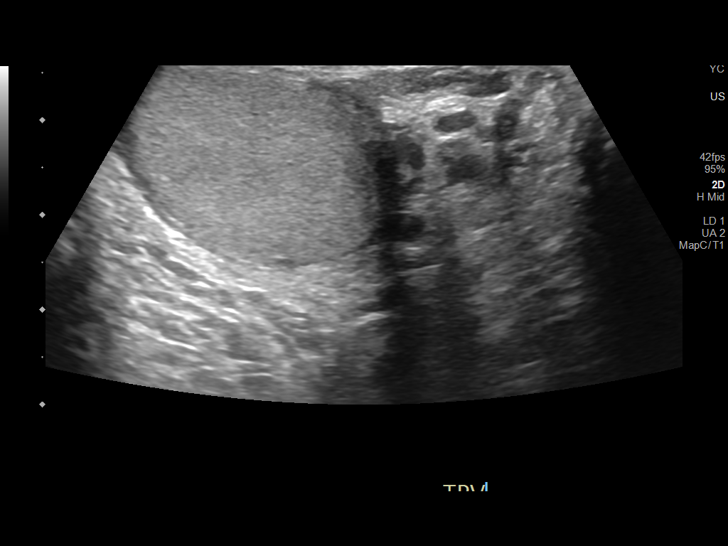
[im 64/64]
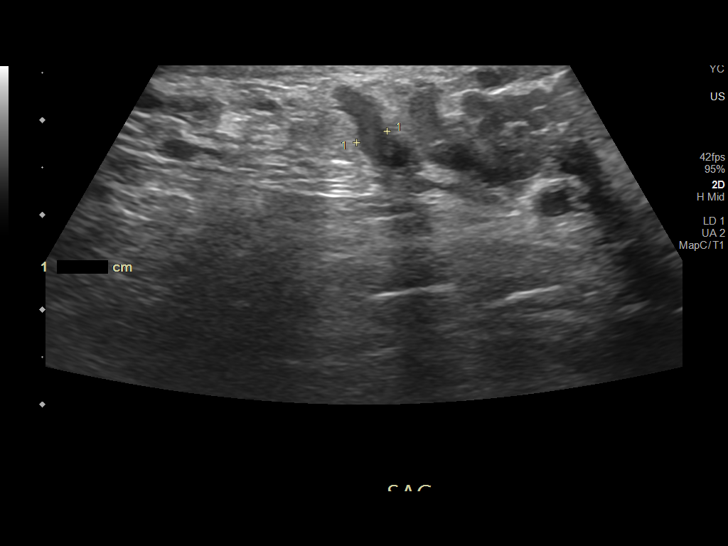

[13 of 25 positions shown; findings below may reference images not displayed]

FINDINGS: Right testicle

Measurements: 5.2 x 3.2 x 4.1 cm. Uniform parenchymal echogenicity.
No concerning testicular mass or microlithiasis.

Left testicle

Measurements: 5.0 x 3.2 x 3.5 cm. Uniform parenchymal echogenicity.
No concerning testicular mass or microlithiasis.

Right epididymis:  Normal in size and appearance.

Left epididymis:  Normal in size and appearance.

Hydrocele:  Bilateral hydroceles, left slightly greater than right.

Varicocele:  Small left varicocele is noted.

Color Doppler evaluation demonstrates normal, symmetric color flow
in both testes. Pulsed Doppler interrogation of both testes
demonstrates normal low resistance arterial and venous waveforms
bilaterally.
IMPRESSION: 1. Small left varicocele, a nonspecific often incidental finding but
can present as a pain generator.
2. No convincing sonographic evidence of testicular mass or torsion.
3. Trace bilateral hydroceles.
